# Patient Record
Sex: Female | Born: 1995 | Race: White | Hispanic: No | Marital: Single | Smoking: Current every day smoker
Health system: Southern US, Community
[De-identification: ages and names within clinical notes are randomized; demographics above are authoritative.]

## PROBLEM LIST (undated history)

## (undated) VITALS — BP 105/65 | HR 89 | Temp 98.2°F | Resp 16 | Ht 62.6 in | Wt 106.9 lb

## (undated) DIAGNOSIS — R51 Headache: Secondary | ICD-10-CM

## (undated) DIAGNOSIS — F32A Depression, unspecified: Secondary | ICD-10-CM

## (undated) DIAGNOSIS — F329 Major depressive disorder, single episode, unspecified: Secondary | ICD-10-CM

## (undated) DIAGNOSIS — F988 Other specified behavioral and emotional disorders with onset usually occurring in childhood and adolescence: Secondary | ICD-10-CM

## (undated) DIAGNOSIS — F419 Anxiety disorder, unspecified: Secondary | ICD-10-CM

---
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
## 2015-09-11 ENCOUNTER — Encounter (HOSPITAL_COMMUNITY): Payer: Self-pay | Admitting: *Deleted

## 2015-09-11 ENCOUNTER — Emergency Department (HOSPITAL_COMMUNITY)
Admission: EM | Admit: 2015-09-11 | Discharge: 2015-09-11 | Disposition: A | Discharge status: Home / Self Care | Payer: PRIVATE HEALTH INSURANCE | Attending: Emergency Medicine | Admitting: Emergency Medicine

## 2015-09-11 ENCOUNTER — Emergency Department (HOSPITAL_COMMUNITY): Payer: PRIVATE HEALTH INSURANCE

## 2015-09-11 DIAGNOSIS — Y998 Other external cause status: Secondary | ICD-10-CM | POA: Diagnosis not present

## 2015-09-11 DIAGNOSIS — S29001A Unspecified injury of muscle and tendon of front wall of thorax, initial encounter: Secondary | ICD-10-CM | POA: Insufficient documentation

## 2015-09-11 DIAGNOSIS — S199XXA Unspecified injury of neck, initial encounter: Secondary | ICD-10-CM | POA: Insufficient documentation

## 2015-09-11 DIAGNOSIS — S4992XA Unspecified injury of left shoulder and upper arm, initial encounter: Secondary | ICD-10-CM | POA: Diagnosis present

## 2015-09-11 DIAGNOSIS — F17210 Nicotine dependence, cigarettes, uncomplicated: Secondary | ICD-10-CM | POA: Diagnosis not present

## 2015-09-11 DIAGNOSIS — Z880 Allergy status to penicillin: Secondary | ICD-10-CM | POA: Diagnosis not present

## 2015-09-11 DIAGNOSIS — Y9389 Activity, other specified: Secondary | ICD-10-CM | POA: Diagnosis not present

## 2015-09-11 DIAGNOSIS — S0990XA Unspecified injury of head, initial encounter: Secondary | ICD-10-CM | POA: Diagnosis not present

## 2015-09-11 DIAGNOSIS — Z79899 Other long term (current) drug therapy: Secondary | ICD-10-CM | POA: Insufficient documentation

## 2015-09-11 DIAGNOSIS — M25512 Pain in left shoulder: Secondary | ICD-10-CM

## 2015-09-11 DIAGNOSIS — Y92410 Unspecified street and highway as the place of occurrence of the external cause: Secondary | ICD-10-CM | POA: Insufficient documentation

## 2015-09-11 MED ORDER — ACETAMINOPHEN 500 MG PO TABS: 500.0000 mg | ORAL_TABLET | Freq: Four times a day (QID) | ORAL | Status: DC | PRN

## 2015-09-11 MED ORDER — IBUPROFEN 800 MG PO TABS: 800.0000 mg | ORAL_TABLET | Freq: Three times a day (TID) | ORAL | Status: DC

## 2015-09-11 MED ORDER — IBUPROFEN 400 MG PO TABS
800.0000 mg | ORAL_TABLET | Freq: Once | ORAL | Status: AC
  Administered 2015-09-11: 800 mg via ORAL
  Filled 2015-09-11: qty 2

## 2015-09-11 NOTE — ED Notes (Signed)
See PA assessment 
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
## 2015-09-11 NOTE — ED Notes (Signed)
Declined W/C at D/C and was escorted to lobby by RN. 
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
## 2019-03-19 ENCOUNTER — Ambulatory Visit (HOSPITAL_COMMUNITY)
Admission: EM | Admit: 2019-03-19 | Discharge: 2019-03-19 | Disposition: A | Discharge status: Home / Self Care | Payer: BC Managed Care – PPO | Attending: Urgent Care | Admitting: Urgent Care

## 2019-03-19 ENCOUNTER — Other Ambulatory Visit: Payer: Self-pay

## 2019-03-19 ENCOUNTER — Encounter (HOSPITAL_COMMUNITY): Payer: Self-pay

## 2019-03-19 DIAGNOSIS — W540XXA Bitten by dog, initial encounter: Secondary | ICD-10-CM

## 2019-03-19 DIAGNOSIS — M79641 Pain in right hand: Secondary | ICD-10-CM

## 2019-03-19 DIAGNOSIS — S61451A Open bite of right hand, initial encounter: Secondary | ICD-10-CM | POA: Diagnosis not present

## 2019-03-19 DIAGNOSIS — Z23 Encounter for immunization: Secondary | ICD-10-CM

## 2019-03-19 MED ORDER — TETANUS-DIPHTH-ACELL PERTUSSIS 5-2.5-18.5 LF-MCG/0.5 IM SUSP
0.5000 mL | Freq: Once | INTRAMUSCULAR | Status: AC
  Administered 2019-03-19: 0.5 mL via INTRAMUSCULAR

## 2019-03-19 MED ORDER — DOXYCYCLINE HYCLATE 100 MG PO CAPS: 100.0000 mg | ORAL_CAPSULE | Freq: Two times a day (BID) | ORAL | 0 refills | Status: DC

## 2019-03-19 MED ORDER — METRONIDAZOLE 500 MG PO TABS: 500.0000 mg | ORAL_TABLET | Freq: Three times a day (TID) | ORAL | 0 refills | Status: DC

## 2019-03-19 NOTE — ED Provider Notes (Signed)
  MRN: [id number] DOB: March 04, 1996  Subjective:   Natalie Guerra is a 23 y.o. female presenting for suffering a right hand wound while at work today.  Patient states that she was trying to get a dog out of the crate and unfortunately the dog bit her right thumb and hand.  Patient reports that the dog is vaccinated.  Has had pain and difficulty moving her thumb to the full range of motion.  Denies drainage of pus or active bleeding, laceration.  She cannot recall when her last Tdap was updated.  Natalie Guerra has a medication list, allergies, pmh and psh that were reviewed, updated as appropriate and not included due to being a worker's injury case.  ROS Denies fever, nausea, vomiting, swelling, loss of sensation.  Objective:   Vitals: BP 118/76   Pulse 87   Temp 98.9 F (37.2 C)   Resp 16   Wt 110 lb (49.9 kg)   LMP 03/12/2019   SpO2 100%   BMI 18.88 kg/m   Physical Exam Constitutional:      General: She is not in acute distress.    Appearance: Normal appearance. She is well-developed. She is not ill-appearing.  HENT:     Head: Normocephalic and atraumatic.     Nose: Nose normal.     Mouth/Throat:     Mouth: Mucous membranes are moist.     Pharynx: Oropharynx is clear.  Eyes:     General: No scleral icterus.    Extraocular Movements: Extraocular movements intact.     Pupils: Pupils are equal, round, and reactive to light.  Cardiovascular:     Rate and Rhythm: Normal rate.  Pulmonary:     Effort: Pulmonary effort is normal.  Musculoskeletal:       Hands:  Skin:    General: Skin is warm and dry.  Neurological:     General: No focal deficit present.     Mental Status: She is alert and oriented to person, place, and time.  Psychiatric:        Mood and Affect: Mood normal.        Behavior: Behavior normal.      Assessment and Plan :   1. Right hand pain   2. Dog bite of right hand, initial encounter   3. Need for diphtheria-tetanus-pertussis (Tdap) vaccine     [facility] registry shows that patient needs to have her Tdap updated as her last one was in 2009.  Reports allergies to amoxicillin including swelling and therefore we will use doxycycline and Flagyl for antibiotic prophylaxis given nature of her wound and location.  Wound care reviewed, I wrapped patient's right thumb and wrist using an Ace wrap to limit her movement.  Recommended patient follow-up in a couple of days and use over-the-counter NSAID for pain and inflammation.  Counseled patient on potential for adverse effects with medications prescribed/recommended today, strict ER and return-to-clinic precautions discussed, patient verbalized understanding.    Natalie Guerra, [place] 03/19/19 1821

## 2019-03-19 NOTE — ED Triage Notes (Signed)
Pt states she was getting a dog out at work and the dog just bite down on her right hand. ( thumb )

## 2019-06-23 ENCOUNTER — Emergency Department (INDEPENDENT_AMBULATORY_CARE_PROVIDER_SITE_OTHER)
Admission: EM | Admit: 2019-06-23 | Discharge: 2019-06-23 | Disposition: A | Discharge status: Home / Self Care | Payer: BC Managed Care – PPO | Source: Home / Self Care

## 2019-06-23 ENCOUNTER — Other Ambulatory Visit: Payer: Self-pay

## 2019-06-23 DIAGNOSIS — R1031 Right lower quadrant pain: Secondary | ICD-10-CM

## 2019-06-23 DIAGNOSIS — R55 Syncope and collapse: Secondary | ICD-10-CM

## 2019-06-23 DIAGNOSIS — R1032 Left lower quadrant pain: Secondary | ICD-10-CM | POA: Diagnosis not present

## 2019-06-23 DIAGNOSIS — R110 Nausea: Secondary | ICD-10-CM

## 2019-06-23 HISTORY — DX: Other specified behavioral and emotional disorders with onset usually occurring in childhood and adolescence: F98.8

## 2019-06-23 HISTORY — DX: Depression, unspecified: F32.A

## 2019-06-23 HISTORY — DX: Anxiety disorder, unspecified: F41.9

## 2019-06-23 LAB — POCT URINALYSIS DIP (MANUAL ENTRY)
Bilirubin, UA: NEGATIVE
Glucose, UA: NEGATIVE mg/dL
Ketones, POC UA: NEGATIVE mg/dL
Leukocytes, UA: NEGATIVE
Nitrite, UA: NEGATIVE
Protein Ur, POC: NEGATIVE mg/dL
Spec Grav, UA: 1.025 (ref 1.010–1.025)
Urobilinogen, UA: 0.2 E.U./dL
pH, UA: 6 (ref 5.0–8.0)

## 2019-06-23 LAB — POCT CBC W AUTO DIFF (K'VILLE URGENT CARE)

## 2019-06-23 LAB — POCT FASTING CBG KUC MANUAL ENTRY: POCT Glucose (KUC): 110 mg/dL — AB (ref 70–99)

## 2019-06-23 MED ORDER — ONDANSETRON 4 MG PO TBDP: 4.0000 mg | ORAL_TABLET | Freq: Three times a day (TID) | ORAL | 0 refills | Status: DC | PRN

## 2019-06-23 NOTE — ED Triage Notes (Signed)
Pt felt "weird" at work today.  Not dizzy, but just not feeling well.  As far as stomach, she has been having pain that moves around.  Left side,  Pt denies diabetes, or hypoglycemia.  Ate a half a biscuit and a cookie this am.

## 2019-06-23 NOTE — Discharge Instructions (Signed)
°  Be sure to get a lot of rest and stay well hydrated with sports drinks, water, diluted juices, and clear sodas.  Avoid fried fatty food, spicy food, and milk as these foods can cause worsening stomach upset.   Please follow up with family medicine in 2-3 days if not improving, sooner if worsening.  Call 911 or go to the hospital if symptoms worsening- worsening pain, passing out, unable to keep down fluids, or other new concerning symptoms develop.

## 2019-06-23 NOTE — ED Provider Notes (Signed)
[facility]    CSN: [id number] Arrival date & time: 06/23/19  1323      History   Chief Complaint Chief Complaint  Patient presents with  . Dizziness  . Abdominal Pain    HPI Lateria Alderman is a 23 y.o. female.   HPI  Marjorie Deprey is a 23 y.o. female presenting to UC with c/o episode of near syncope that occurred at work just PTA. Pt works at [employer] and was helping hold a dog when her symptoms started. She reports feeling "weird" and mildly nauseated, started seeing black dots, then sat down.  She started to feel better after drinking a few sips of soda and resting but she was sent home from work.  She also reports mild abdominal cramping but notes that is likely due to her menses starting today. She has not taken anything for her abdominal cramping, 6/10.  She does report decreased appetite the last few days. She has only had half a biscuit and cookie for breakfast this morning. Pt denies HA, chest pain or palpitations. No hx of diabetes. Denies recent URI symptoms. No vomiting or diarrhea.  No change in medications.   Past Medical History:  Diagnosis Date  . ADD (attention deficit disorder)   . Anxiety   . Depression   . Headache(784.0) 09/15/2012   Current; pt scales at 5 of 10    Patient Active Problem List   Diagnosis Date Noted  . Cannabis abuse 09/23/2012  . MDD (major depressive disorder), single episode, severe (HCC) 09/16/2012  . GAD (generalized anxiety disorder) 09/16/2012    History reviewed. No pertinent surgical history.  OB History   No obstetric history on file.      Home Medications    Prior to Admission medications   Medication Sig Start Date End Date Taking? Authorizing Provider  acetaminophen (TYLENOL) 500 MG tablet Take 1 tablet (500 mg total) by mouth every 6 (six) hours as needed. 09/11/15   Cheri Fowler, PA-C  doxycycline (VIBRAMYCIN) 100 MG capsule Take 1 capsule (100 mg total) by mouth 2 (two) times daily with a meal. 03/19/19    Wallis Bamberg, PA-C  hydrOXYzine (ATARAX/VISTARIL) 50 MG tablet Take 1 tablet (50 mg total) by mouth at bedtime. 09/23/12   Winson, Louie Bun, NP  ibuprofen (ADVIL,MOTRIN) 800 MG tablet Take 1 tablet (800 mg total) by mouth 3 (three) times daily. 09/11/15   Cheri Fowler, PA-C  metroNIDAZOLE (FLAGYL) 500 MG tablet Take 1 tablet (500 mg total) by mouth 3 (three) times daily. 03/19/19   Wallis Bamberg, PA-C  ondansetron (ZOFRAN ODT) 4 MG disintegrating tablet Take 1 tablet (4 mg total) by mouth every 8 (eight) hours as needed for nausea or vomiting. 06/23/19   Lurene Shadow, PA-C  sertraline (ZOLOFT) 100 MG tablet Take 1 tablet (100 mg total) by mouth daily. 09/23/12   Jolene Schimke, NP    Family History History reviewed. No pertinent family history.  Social History Social History   Tobacco Use  . Smoking status: Current Every Day Smoker    Packs/day: 0.20    Years: 1.00    Pack years: 0.20    Types: Cigarettes  . Smokeless tobacco: Never Used  Substance Use Topics  . Alcohol use: Yes    Comment: 1 a week  . Drug use: Yes    Types: Marijuana     Allergies   Amoxicillin   Review of Systems Review of Systems  Constitutional: Negative for chills and fever.  Eyes: Positive for visual disturbance ( black dots for a few seconds, resolved after sitting down). Negative for photophobia.  Gastrointestinal: Positive for abdominal pain and nausea. Negative for diarrhea and vomiting.  Genitourinary: Negative for dysuria, flank pain, frequency and hematuria.  Musculoskeletal: Negative for arthralgias, back pain and myalgias.  Neurological: Positive for weakness and light-headedness. Negative for dizziness, syncope, numbness and headaches.     Physical Exam Triage Vital Signs ED Triage Vitals  Enc Vitals Group     BP 06/23/19 1342 102/68     Pulse Rate 06/23/19 1342 82     Resp 06/23/19 1342 20     Temp 06/23/19 1342 98.7 F (37.1 C)     Temp src --      SpO2 06/23/19 1342 97 %     Weight  06/23/19 1343 107 lb (48.5 kg)     Height 06/23/19 1343 5\' 4"  (1.626 m)     Head Circumference --      Peak Flow --      Pain Score 06/23/19 1342 6     Pain Loc --      Pain Edu? --      Excl. in [place]? --    No data found.  Updated Vital Signs BP 102/68 (BP Location: Right Arm)   Pulse 82   Temp 98.7 F (37.1 C)   Resp 20   Ht 5\' 4"  (1.626 m)   Wt 107 lb (48.5 kg)   LMP 06/23/2019   SpO2 97%   BMI 18.37 kg/m   Visual Acuity Right Eye Distance:   Left Eye Distance:   Bilateral Distance:    Right Eye Near:   Left Eye Near:    Bilateral Near:     Physical Exam Vitals signs and nursing note reviewed.  Constitutional:      General: She is not in acute distress.    Appearance: She is well-developed. She is not ill-appearing, toxic-appearing or diaphoretic.     Comments: Pt sitting on exam bed, NAD. Alert and cooperative during exam.   HENT:     Head: Normocephalic and atraumatic.     Right Ear: Tympanic membrane and ear canal normal.     Left Ear: Tympanic membrane and ear canal normal.     Nose: Nose normal.     Right Sinus: No maxillary sinus tenderness or frontal sinus tenderness.     Left Sinus: No maxillary sinus tenderness or frontal sinus tenderness.     Mouth/Throat:     Lips: Pink.     Mouth: Mucous membranes are moist.     Pharynx: Oropharynx is clear. Uvula midline.  Neck:     Musculoskeletal: Normal range of motion.  Cardiovascular:     Rate and Rhythm: Normal rate and regular rhythm.  Pulmonary:     Effort: Pulmonary effort is normal.     Breath sounds: Normal breath sounds.  Abdominal:     General: There is no distension.     Palpations: Abdomen is soft.     Tenderness: There is no abdominal tenderness.  Musculoskeletal: Normal range of motion.  Skin:    General: Skin is warm and dry.     Capillary Refill: Capillary refill takes less than 2 seconds.  Neurological:     Mental Status: She is alert and oriented to person, place, and time.   Psychiatric:        Behavior: Behavior normal.      UC Treatments / Results  Labs (all labs  ordered are listed, but only abnormal results are displayed) Labs Reviewed  POCT URINALYSIS DIP (MANUAL ENTRY) - Abnormal; Notable for the following components:      Result Value   Blood, UA small (*)    All other components within normal limits  POCT FASTING CBG [facility] MANUAL ENTRY - Abnormal; Notable for the following components:   POCT Glucose ([facility]) 110 (*)    All other components within normal limits  BASIC METABOLIC PANEL  POCT CBC W AUTO DIFF ([facility])    EKG   Radiology No results found.  Procedures Procedures (including critical care time)  Medications Ordered in UC Medications - No data to display  Initial Impression / Assessment and Plan / UC Course  I have reviewed the triage vital signs and the nursing notes.  Pertinent labs & imaging results that were available during my care of the patient were reviewed by me and considered in my medical decision making (see chart for details).     Pt appears well, NAD Benign abdominal exam UA unremarkable, blood c/w pt being on menses Pt declined pregnancy test, denies concern for pregnancy. Denies concern for STIs.  CBC: slight elevated WBC, advised pt she may be developing symptoms from a mild viral illness, no evidence of anemia  Encouraged good hydration, trying to eat more well balanced meals. Plenty of rest F/u with PCP Discussed symptoms that warrant emergent care in the ED. AVS provided  Final Clinical Impressions(s) / UC Diagnoses   Final diagnoses:  Near syncope  Bilateral lower abdominal cramping  Nausea without vomiting     Discharge Instructions      Be sure to get a lot of rest and stay well hydrated with sports drinks, water, diluted juices, and clear sodas.  Avoid fried fatty food, spicy food, and milk as these foods can cause worsening stomach upset.   Please follow up with family  medicine in 2-3 days if not improving, sooner if worsening.  Call 911 or go to the hospital if symptoms worsening- worsening pain, passing out, unable to keep down fluids, or other new concerning symptoms develop.     ED Prescriptions    Medication Sig Dispense Auth. Provider   ondansetron (ZOFRAN ODT) 4 MG disintegrating tablet  (Status: Discontinued) Take 1 tablet (4 mg total) by mouth every 8 (eight) hours as needed for nausea or vomiting. 4mg  ODT q4 hours prn nausea/vomit 8 tablet Doroteo GlassmanPhelps, Nema Oatley O, PA-C   ondansetron (ZOFRAN ODT) 4 MG disintegrating tablet Take 1 tablet (4 mg total) by mouth every 8 (eight) hours as needed for nausea or vomiting. 8 tablet Lurene ShadowPhelps, [place]er [place] O, [place]PA-C     I have reviewed the PDMP during this encounter.   Lurene Shadowhelps, Reynold Mantell O, [place]PA-C 06/23/19 1729

## 2019-06-24 LAB — BASIC METABOLIC PANEL
BUN: 11 mg/dL (ref 7–25)
CO2: 25 mmol/L (ref 20–32)
Calcium: 9.1 mg/dL (ref 8.6–10.2)
Chloride: 105 mmol/L (ref 98–110)
Creat: 0.93 mg/dL (ref 0.50–1.10)
Glucose, Bld: 85 mg/dL (ref 65–99)
Potassium: 3.7 mmol/L (ref 3.5–5.3)
Sodium: 138 mmol/L (ref 135–146)

## 2019-08-21 ENCOUNTER — Encounter (HOSPITAL_COMMUNITY): Payer: Self-pay

## 2019-08-21 ENCOUNTER — Ambulatory Visit (HOSPITAL_COMMUNITY)
Admission: EM | Admit: 2019-08-21 | Discharge: 2019-08-21 | Disposition: A | Discharge status: Home / Self Care | Payer: BC Managed Care – PPO | Attending: Family Medicine | Admitting: Family Medicine

## 2019-08-21 ENCOUNTER — Other Ambulatory Visit: Payer: Self-pay

## 2019-08-21 DIAGNOSIS — J029 Acute pharyngitis, unspecified: Secondary | ICD-10-CM

## 2019-08-21 DIAGNOSIS — Z20822 Contact with and (suspected) exposure to covid-19: Secondary | ICD-10-CM

## 2019-08-21 DIAGNOSIS — Z20828 Contact with and (suspected) exposure to other viral communicable diseases: Secondary | ICD-10-CM | POA: Diagnosis present

## 2019-08-21 LAB — POCT RAPID STREP A: Streptococcus, Group A Screen (Direct): NEGATIVE

## 2019-08-21 MED ORDER — LIDOCAINE VISCOUS HCL 2 % MT SOLN: 15.0000 mL | Freq: Four times a day (QID) | OROMUCOSAL | 0 refills | Status: DC | PRN

## 2019-08-21 MED ORDER — ONDANSETRON 4 MG PO TBDP: 4.0000 mg | ORAL_TABLET | Freq: Three times a day (TID) | ORAL | 0 refills | Status: DC | PRN

## 2019-08-21 NOTE — ED Triage Notes (Signed)
Patient presents to Urgent Care with complaints of headache and sore throat since 7 days ago. Patient reports her mother sent her here to be covid tested.

## 2019-08-21 NOTE — Discharge Instructions (Addendum)
Use the code on this paperwork to set-up your Mychart. All negative results will be communicated  via My chart. If symptoms worsen, please follow-up with PCP or return her to urgent care for evaluation.

## 2019-08-21 NOTE — ED Provider Notes (Signed)
[facility]    CSN: [id number] Arrival date & time: 08/21/19  1038      History   Chief Complaint Chief Complaint  Patient presents with  . Sore Throat  . Headache    HPI Natalie Guerra is a 23 y.o. female.   HPI. Natalie Guerra presents for evaluation of sore throat, cough, nausea, and intermittent loose stool. Pt would like a COVID test and strep test. Patient is afebrile.  Exposure:No known exposure to anyone postive for COVID-19. Onset of symptoms: 7 days ago.  Previously tested: No Past Medical History:  Diagnosis Date  . ADD (attention deficit disorder)   . Anxiety   . Depression   . Headache(784.0) 09/15/2012   Current; pt scales at 5 of 10    Patient Active Problem List   Diagnosis Date Noted  . Cannabis abuse 09/23/2012  . MDD (major depressive disorder), single episode, severe ([place]) 09/16/2012  . GAD (generalized anxiety disorder) 09/16/2012    History reviewed. No pertinent surgical history.  OB History   No obstetric history on file.      Home Medications    Prior to Admission medications   Medication Sig Start Date End Date Taking? Authorizing Provider  acetaminophen (TYLENOL) 500 MG tablet Take 1 tablet (500 mg total) by mouth every 6 (six) hours as needed. 09/11/15   Gloriann Loan, PA-C  doxycycline (VIBRAMYCIN) 100 MG capsule Take 1 capsule (100 mg total) by mouth 2 (two) times daily with a meal. 03/19/19   Jaynee Eagles, PA-C  hydrOXYzine (ATARAX/VISTARIL) 50 MG tablet Take 1 tablet (50 mg total) by mouth at bedtime. 09/23/12   Winson, Manus Rudd, NP  ibuprofen (ADVIL,MOTRIN) 800 MG tablet Take 1 tablet (800 mg total) by mouth 3 (three) times daily. 09/11/15   Gloriann Loan, PA-C  metroNIDAZOLE (FLAGYL) 500 MG tablet Take 1 tablet (500 mg total) by mouth 3 (three) times daily. 03/19/19   Jaynee Eagles, PA-C  ondansetron (ZOFRAN ODT) 4 MG disintegrating tablet Take 1 tablet (4 mg total) by mouth every 8 (eight) hours as needed for nausea or vomiting.  06/23/19   Noe Gens, PA-C  sertraline (ZOLOFT) 100 MG tablet Take 1 tablet (100 mg total) by mouth daily. 09/23/12   Winson, Manus Rudd, NP    Family History Family History  Problem Relation Age of Onset  . Healthy Mother   . Healthy Father     Social History Social History   Tobacco Use  . Smoking status: Current Every Day Smoker    Packs/day: 0.20    Years: 1.00    Pack years: 0.20    Types: Cigarettes  . Smokeless tobacco: Never Used  Substance Use Topics  . Alcohol use: Yes    Comment: 1 a week  . Drug use: Yes    Types: Marijuana     Allergies   Amoxicillin   Review of Systems Review of Systems Pertinent negatives listed in HPI  Physical Exam Triage Vital Signs ED Triage Vitals  Enc Vitals Group     BP 08/21/19 1053 (!) 101/57     Pulse Rate 08/21/19 1053 88     Resp 08/21/19 1053 16     Temp 08/21/19 1053 98.9 F (37.2 C)     Temp Source 08/21/19 1053 Oral     SpO2 08/21/19 1053 98 %     Weight --      Height --      Head Circumference --  Peak Flow --      Pain Score 08/21/19 1051 0     Pain Loc --      Pain Edu? --      Excl. in [place]? --    No data found.  Updated Vital Signs BP (!) 101/57 (BP Location: Left Arm)   Pulse 88   Temp 98.9 F (37.2 C) (Oral)   Resp 16   SpO2 98%   Visual Acuity Right Eye Distance:   Left Eye Distance:   Bilateral Distance:    Right Eye Near:   Left Eye Near:    Bilateral Near:     Physical Exam   UC Treatments / Results  Labs (all labs ordered are listed, but only abnormal results are displayed) Labs Reviewed  NOVEL CORONAVIRUS, NAA (HOSP ORDER, SEND-OUT TO REF LAB; TAT 18-24 HRS)  CULTURE, GROUP A STREP [facility])  POCT RAPID STREP A    EKG   Radiology No results found.  Procedures Procedures (including critical care time)  Medications Ordered in UC Medications - No data to display  Initial Impression / Assessment and Plan / UC Course  I have reviewed the triage vital signs and  the nursing notes.  Pertinent labs & imaging results that were available during my care of the patient were reviewed by me and considered in my medical decision making (see chart for details).   Encounter for COVID-19 testing. Afebrile. Non-specific symptoms present. No concerning findings noted during exam. Rapid strep negative. Strep culture pending. Symptomatic management indicated only. Lidocaine prescribed for sore throat management and Zofran prescribed for nausea management. Patient advised to return for follow-up if symptoms worsen or do not improve. CDC recommends quarantine a minimal of 10 days if symptoms or present or until COVID-19 test is results are known. Pt verbalized understanding and agreement with plan.  Final Clinical Impressions(s) / UC Diagnoses   Final diagnoses:  None   Discharge Instructions   None    ED Prescriptions    Medication Sig Dispense Auth. Provider   ondansetron (ZOFRAN ODT) 4 MG disintegrating tablet Take 1 tablet (4 mg total) by mouth every 8 (eight) hours as needed for nausea or vomiting. 20 tablet Bing Neighbors, FNP   lidocaine (XYLOCAINE) 2 % solution Use as directed 15 mLs in the mouth or throat every 6 (six) hours as needed for mouth pain. 100 mL Bing Neighbors, FNP     PDMP not reviewed this encounter.   Bing Neighbors, FNP 08/23/19 2029

## 2019-08-22 LAB — NOVEL CORONAVIRUS, NAA (HOSP ORDER, SEND-OUT TO REF LAB; TAT 18-24 HRS): SARS-CoV-2, NAA: NOT DETECTED

## 2019-08-23 LAB — CULTURE, GROUP A STREP (THRC)

## 2022-01-05 ENCOUNTER — Encounter: Payer: Self-pay | Admitting: Emergency Medicine

## 2022-01-05 ENCOUNTER — Emergency Department (INDEPENDENT_AMBULATORY_CARE_PROVIDER_SITE_OTHER)
Admission: EM | Admit: 2022-01-05 | Discharge: 2022-01-05 | Disposition: A | Discharge status: Home / Self Care | Payer: Self-pay | Source: Home / Self Care

## 2022-01-05 DIAGNOSIS — W5503XA Scratched by cat, initial encounter: Secondary | ICD-10-CM

## 2022-01-05 DIAGNOSIS — S60811A Abrasion of right wrist, initial encounter: Secondary | ICD-10-CM

## 2022-01-05 MED ORDER — CLINDAMYCIN HCL 150 MG PO CAPS: 150.0000 mg | ORAL_CAPSULE | Freq: Two times a day (BID) | ORAL | 0 refills | Status: AC

## 2022-01-05 NOTE — ED Triage Notes (Signed)
Pt works at [employer]. Pt states " a diabetic cat scratched the inside of her right wrist at 1250 pm, the same cat also scratched the right index finger". Pt has been applying an ointment from work on the index finger. No signs of infection noted.  ?

## 2022-01-05 NOTE — Discharge Instructions (Addendum)
Instructed patient to take medication as directed with food to completion.  Encouraged patient to increase daily water intake while taking this medication.  Encouraged patient to leave wound areas uncovered to allow to heal by secondary intention/scab formation, no topical antibiotic ointment will be necessary.  Advised patient if signs/symptoms of infection evolve please follow-up PCP or here for further evaluation. ?

## 2022-01-05 NOTE — ED Provider Notes (Signed)
?[facility] ? ? ? ?CSN: [id number] ?Arrival date & time: 01/05/22  1323 ? ? ?  ? ?History   ?Chief Complaint ?Chief Complaint  ?Patient presents with  ? cat scratch   ? ? ?HPI ?Natalie Guerra is a 26 y.o. female.  ? ?HPI 26 year old female presents with cat scratch of right wrist and right index finger that occurred at 12:50 PM today. ? ?Past Medical History:  ?Diagnosis Date  ? ADD (attention deficit disorder)   ? Anxiety   ? Depression   ? Headache(784.0) 09/15/2012  ? Current; pt scales at 5 of 10  ? ? ?Patient Active Problem List  ? Diagnosis Date Noted  ? Cannabis abuse 09/23/2012  ? MDD (major depressive disorder), single episode, severe (HCC) 09/16/2012  ? GAD (generalized anxiety disorder) 09/16/2012  ? ? ?History reviewed. No pertinent surgical history. ? ?OB History   ?No obstetric history on file. ?  ? ? ? ?Home Medications   ? ?Prior to Admission medications   ?Medication Sig Start Date End Date Taking? Authorizing Provider  ?clindamycin (CLEOCIN) 150 MG capsule Take 1 capsule (150 mg total) by mouth 2 (two) times daily for 7 days. 01/05/22 01/12/22 Yes Trevor Iha, FNP  ? ? ?Family History ?Family History  ?Problem Relation Age of Onset  ? Healthy Mother   ? Healthy Father   ? ? ?Social History ?Social History  ? ?Tobacco Use  ? Smoking status: Every Day  ?  Packs/day: 0.20  ?  Years: 1.00  ?  Pack years: 0.20  ?  Types: E-cigarettes, Cigarettes  ?  Last attempt to quit: 08/2018  ?  Years since quitting: 3.3  ? Smokeless tobacco: Never  ?Vaping Use  ? Vaping Use: Every day  ? Substances: Nicotine, THC, Flavoring  ?Substance Use Topics  ? Alcohol use: Yes  ?  Comment: 1 a week  ? Drug use: Not Currently  ?  Types: Marijuana  ? ? ? ?Allergies   ?Amoxicillin ? ? ?Review of Systems ?Review of Systems  ?Skin:  Positive for wound.  ? ? ?Physical Exam ?Triage Vital Signs ?ED Triage Vitals  ?Enc Vitals Group  ?   BP 01/05/22 1349 114/76  ?   Pulse Rate 01/05/22 1349 80  ?   Resp 01/05/22 1349 17  ?    Temp 01/05/22 1349 98.9 ?F (37.2 ?C)  ?   Temp Source 01/05/22 1349 Oral  ?   SpO2 01/05/22 1349 98 %  ?   Weight 01/05/22 1354 106 lb (48.1 kg)  ?   Height 01/05/22 1354 5\' 4"  (1.626 m)  ?   Head Circumference --   ?   Peak Flow --   ?   Pain Score 01/05/22 1352 3  ?   Pain Loc --   ?   Pain Edu? --   ?   Excl. in [place]? --   ? ?No data found. ? ?Updated Vital Signs ?BP 114/76 (BP Location: Left Arm)   Pulse 80   Temp 98.9 ?F (37.2 ?C) (Oral)   Resp 17   Ht 5\' 4"  (1.626 m)   Wt 106 lb (48.1 kg)   LMP 01/03/2022 (Exact Date)   SpO2 98%   BMI 18.19 kg/m?  ? ? ? ?Physical Exam ?Vitals and nursing note reviewed.  ?Constitutional:   ?   Appearance: Normal appearance. She is normal weight.  ?HENT:  ?   Head: Normocephalic and atraumatic.  ?   Nose: Nose normal.  ?  Mouth/Throat:  ?   Mouth: Mucous membranes are moist.  ?   Pharynx: Oropharynx is clear.  ?Eyes:  ?   Extraocular Movements: Extraocular movements intact.  ?   Conjunctiva/sclera: Conjunctivae normal.  ?   Pupils: Pupils are equal, round, and reactive to light.  ?Cardiovascular:  ?   Rate and Rhythm: Normal rate and regular rhythm.  ?   Pulses: Normal pulses.  ?   Heart sounds: Normal heart sounds. No murmur heard. ?Pulmonary:  ?   Effort: Pulmonary effort is normal.  ?   Breath sounds: Normal breath sounds. No wheezing, rhonchi or rales.  ?Musculoskeletal:  ?   Cervical back: Normal range of motion and neck supple.  ?Skin: ?   General: Skin is warm and dry.  ?   Comments: Right wrist (volar aspect): pin-point mildly erythematous scratch mark, non-indurated, non-fluctuant, no lymphatic streaking  ?Neurological:  ?   General: No focal deficit present.  ?   Mental Status: She is alert and oriented to person, place, and time.  ? ? ? ?UC Treatments / Results  ?Labs ?(all labs ordered are listed, but only abnormal results are displayed) ?Labs Reviewed - No data to display ? ?EKG ? ? ?Radiology ?No results found. ? ?Procedures ?Procedures (including critical  care time) ? ?Medications Ordered in UC ?Medications - No data to display ? ?Initial Impression / Assessment and Plan / UC Course  ?I have reviewed the triage vital signs and the nursing notes. ? ?Pertinent labs & imaging results that were available during my care of the patient were reviewed by me and considered in my medical decision making (see chart for details). ? ?  ? ?MDM: 1.  Cat scratch of right wrist, initial encounter-Rx'd Clindamycin. Instructed patient to take medication as directed with food to completion.  Encouraged patient to increase daily water intake while taking this medication.  Encouraged patient to leave wound areas uncovered to allow to heal by secondary intention/scab formation, no topical antibiotic ointment will be necessary.  Advised patient if signs/symptoms of infection evolve please follow-up PCP or here for further evaluation.  ?Final Clinical Impressions(s) / UC Diagnoses  ? ?Final diagnoses:  ?Cat scratch of right wrist, initial encounter  ? ? ? ?Discharge Instructions   ? ?  ?Instructed patient to take medication as directed with food to completion.  Encouraged patient to increase daily water intake while taking this medication.  Encouraged patient to leave wound areas uncovered to allow to heal by secondary intention/scab formation, no topical antibiotic ointment will be necessary.  Advised patient if signs/symptoms of infection evolve please follow-up PCP or here for further evaluation. ? ? ? ? ?ED Prescriptions   ? ? Medication Sig Dispense Auth. Provider  ? clindamycin (CLEOCIN) 150 MG capsule Take 1 capsule (150 mg total) by mouth 2 (two) times daily for 7 days. 14 capsule Trevor Iha, FNP  ? ?  ? ?PDMP not reviewed this encounter. ?  ?Trevor Iha, FNP ?01/05/22 1505 ? ?

## 2022-01-05 NOTE — ED Notes (Signed)
Animal scratch form for [facility] completed by patient. ?
